# Patient Record
Sex: Male | Born: 1985
Health system: Southern US, Community
[De-identification: ages and names within clinical notes are randomized; demographics above are authoritative.]

## PROBLEM LIST (undated history)

## (undated) DIAGNOSIS — K219 Gastro-esophageal reflux disease without esophagitis: Secondary | ICD-10-CM

---
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
## 2007-02-22 ENCOUNTER — Ambulatory Visit: Payer: Self-pay

## 2009-09-25 ENCOUNTER — Inpatient Hospital Stay (HOSPITAL_COMMUNITY): Admission: EM | Admit: 2009-09-25 | Discharge: 2009-09-26 | Payer: Self-pay | Admitting: Emergency Medicine

## 2009-09-25 ENCOUNTER — Encounter (INDEPENDENT_AMBULATORY_CARE_PROVIDER_SITE_OTHER): Payer: Self-pay | Admitting: General Surgery

## 2010-10-12 IMAGING — CT CT ABDOMEN W/ CM
2 of 4 series · 14 of 32 positions shown, 19 images · IV contrast (water/omni  & 100 ML OMNI 300)
Comparison: None

CT ABDOMEN

CLINICAL DATA: Right lower quadrant pain.  Fever.

CT ABDOMEN AND PELVIS WITH CONTRAST
TECHNIQUE: Multidetector CT imaging of the abdomen and pelvis was
performed using the standard protocol following bolus
administration of intravenous contrast.
Contrast: 100 ml Zmnipaque-YPP and oral contrast

[Series 2: routine abdomen · axial · 0.83mm/px · z∈[-477,-157]mm · 7 of 86 slices shown, 12 images]
[im 11/86  soft-tissue]
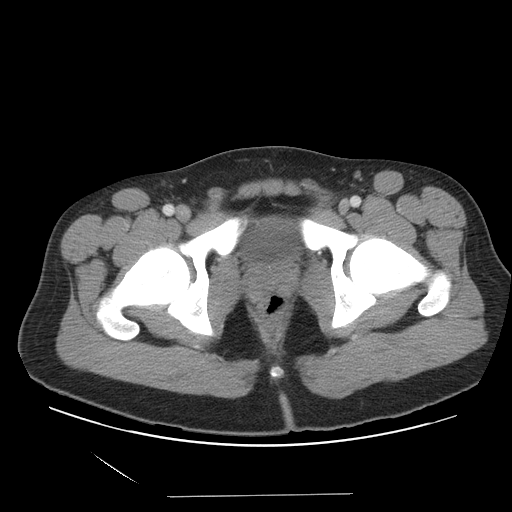
[im 11/86  bone]
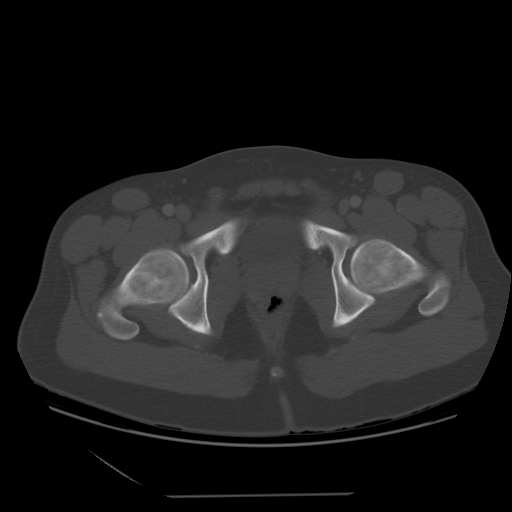
[im 22/86  soft-tissue]
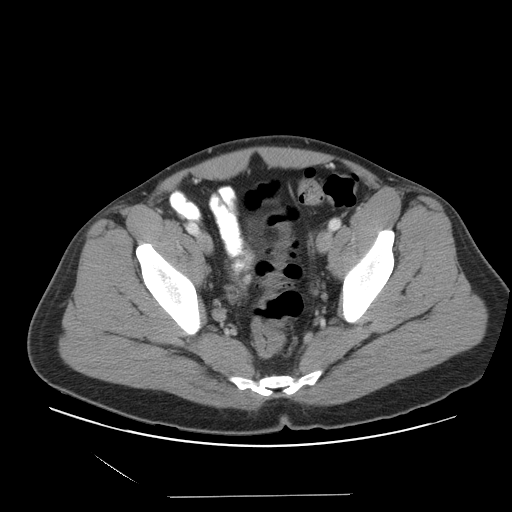
[im 32/86  soft-tissue]
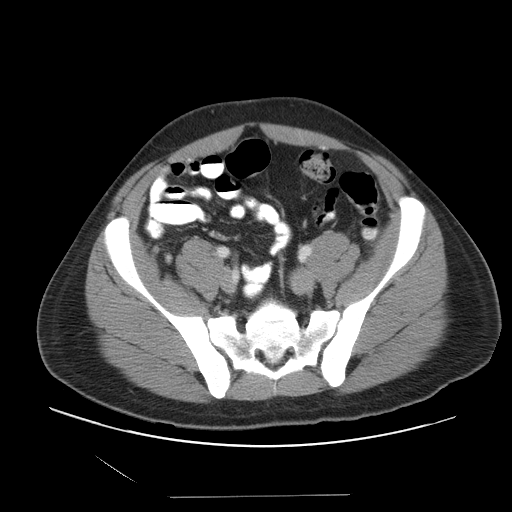
[im 43/86  soft-tissue]
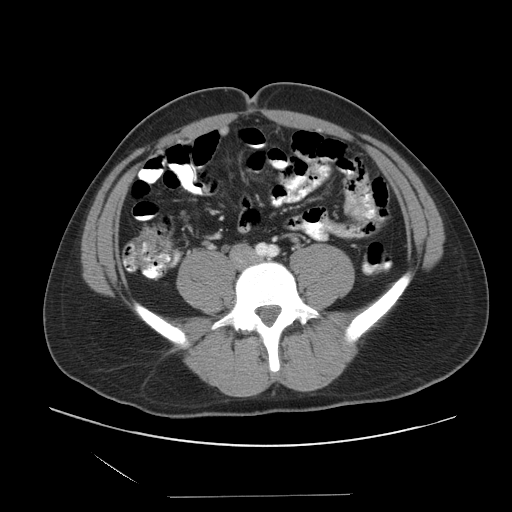
[im 43/86  lung]
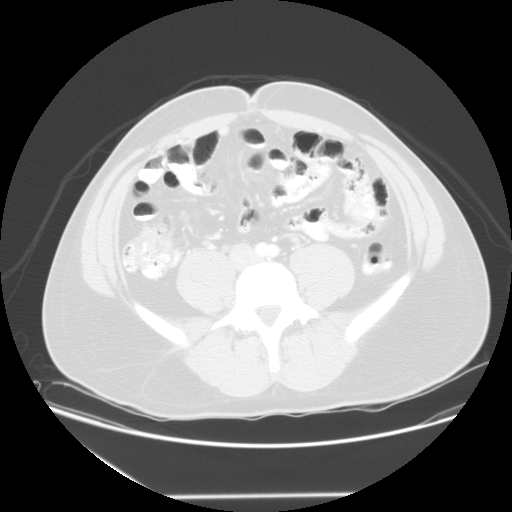
[im 54/86  soft-tissue]
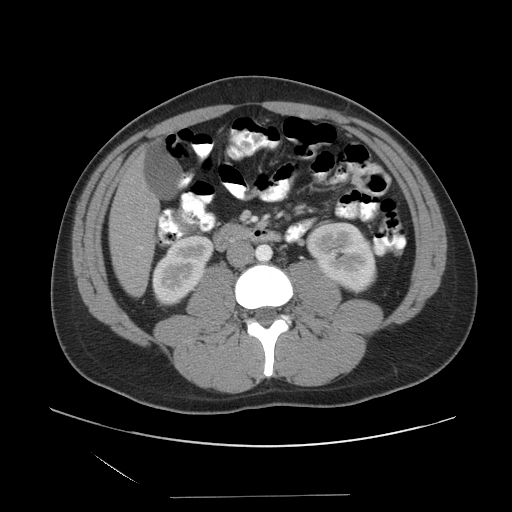
[im 54/86  lung]
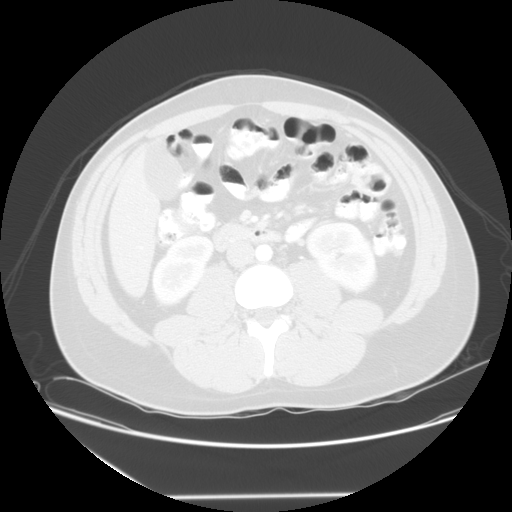
[im 64/86  soft-tissue]
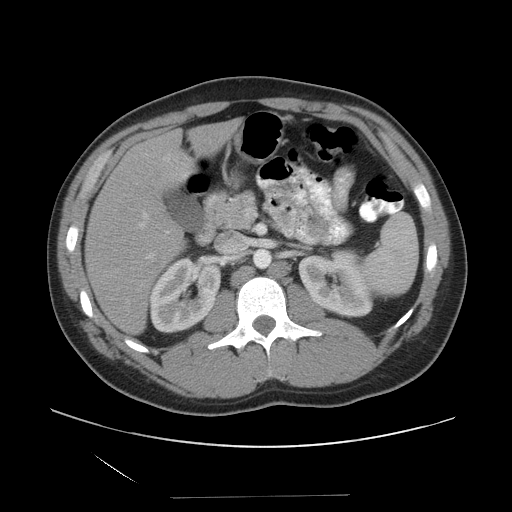
[im 64/86  lung]
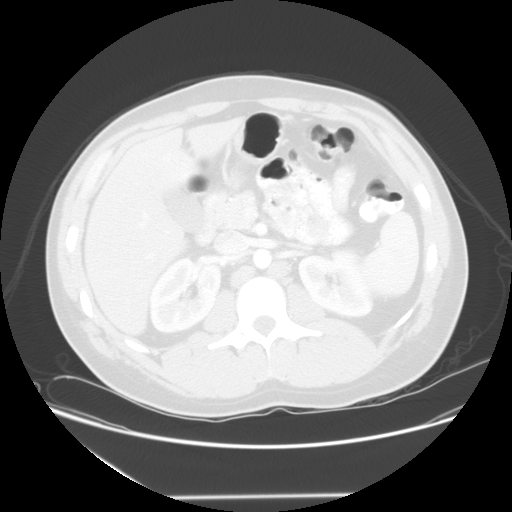
[im 75/86  soft-tissue]
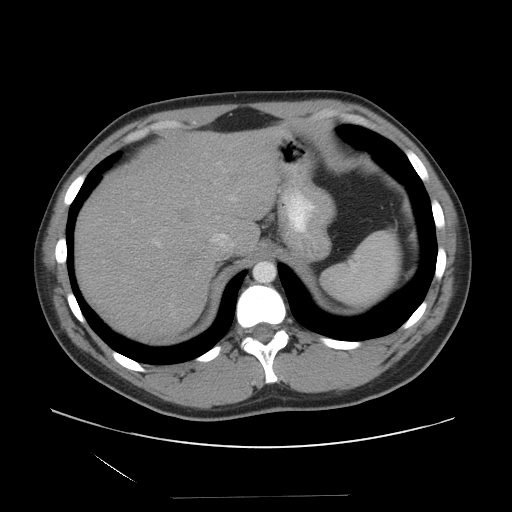
[im 75/86  lung]
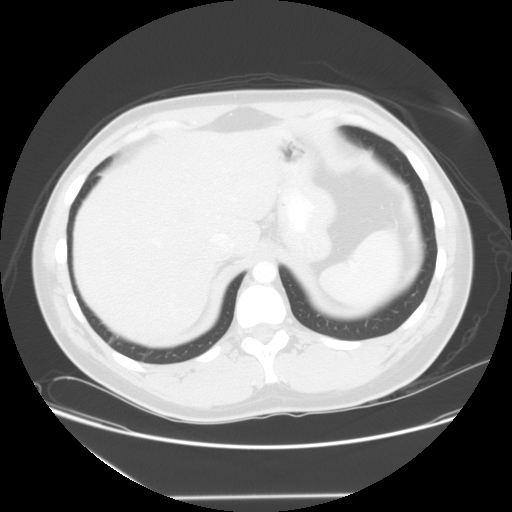

[Series 400: reformatted · sagittal · 0.89mm/px · 7 of 99 slices shown]
[im 10/99  soft-tissue]
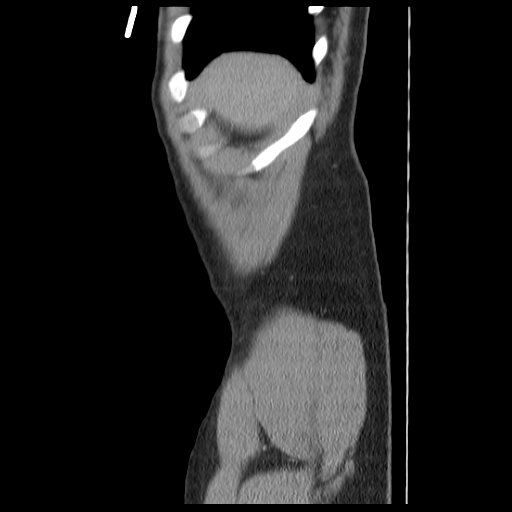
[im 20/99  soft-tissue]
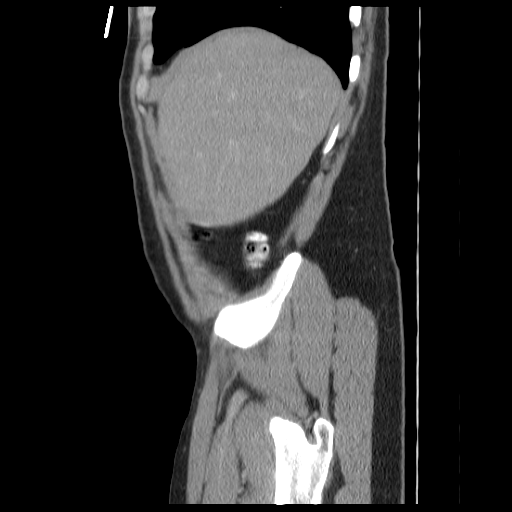
[im 30/99  soft-tissue]
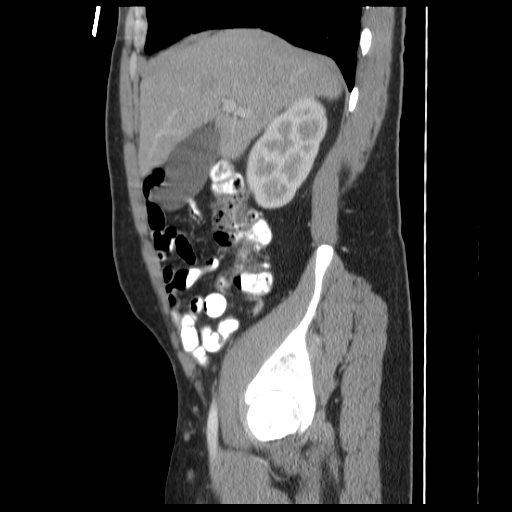
[im 40/99  soft-tissue]
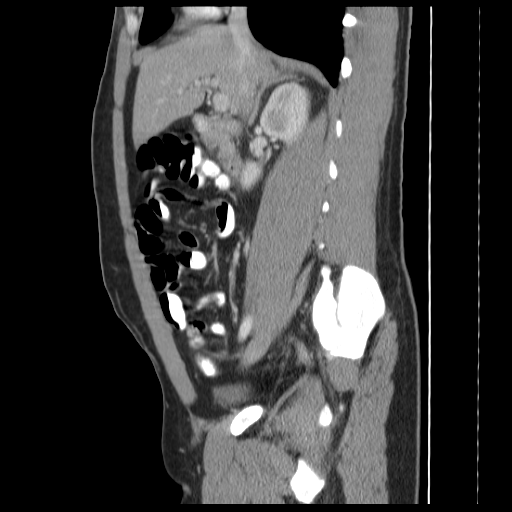
[im 59/99  soft-tissue]
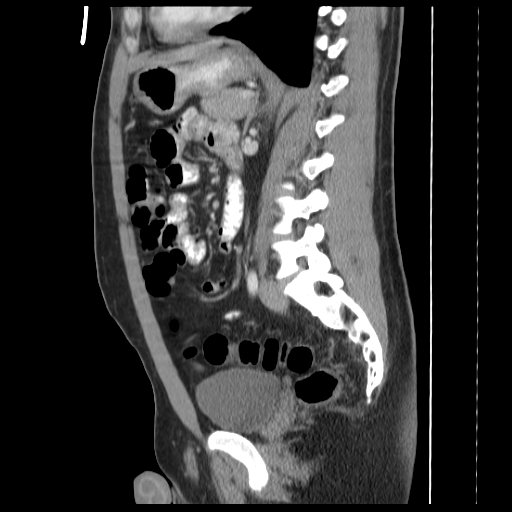
[im 69/99  soft-tissue]
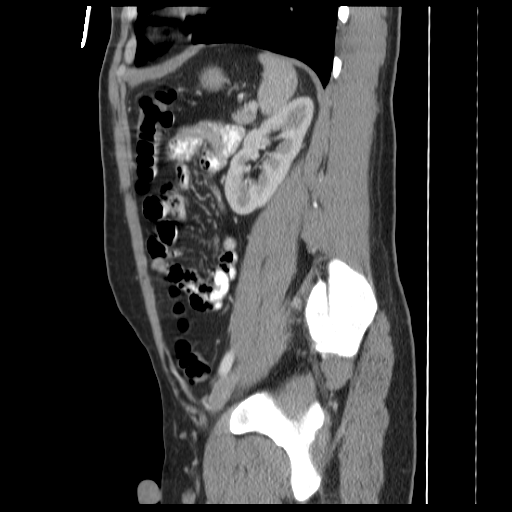
[im 79/99  soft-tissue]
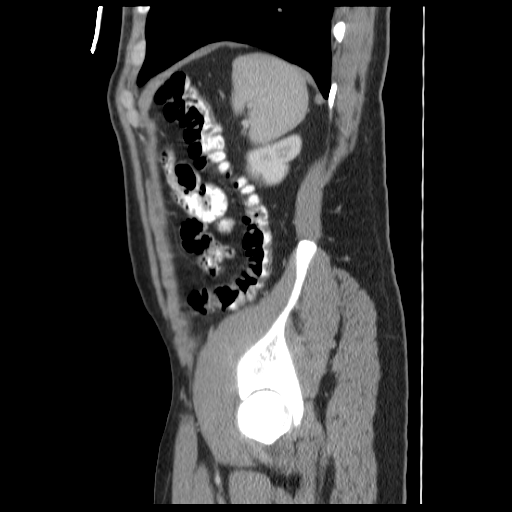

[14 of 32 positions shown; findings below may reference images not displayed]

FINDINGS: The abdominal parenchymal organs are normal in
appearance.  Gallbladder is unremarkable, and there is no evidence
of hydronephrosis.  No soft tissue masses or adenopathy identified
in the abdomen.

There is no evidence of inflammatory process or abnormal fluid
collections.  Abdominal bowel loops appear normal.
IMPRESSION: Negative abdomen CT.

CT PELVIS
FINDINGS: There is no evidence of pelvic masses or
lymphadenopathy. No evidence of ureteral calculi.

Normal appendix is visualized.  There is no evidence of
inflammatory process or abnormal fluid collections in the pelvis.
Pelvic bowel loops appear normal.
IMPRESSION: Negative pelvis CT.  No evidence of appendicitis or other acute
findings.

## 2010-12-16 ENCOUNTER — Emergency Department (HOSPITAL_COMMUNITY)
Admission: EM | Admit: 2010-12-16 | Discharge: 2010-12-16 | Payer: Self-pay | Source: Home / Self Care | Admitting: Emergency Medicine

## 2010-12-18 ENCOUNTER — Emergency Department (HOSPITAL_COMMUNITY)
Admission: EM | Admit: 2010-12-18 | Discharge: 2010-12-18 | Payer: Self-pay | Source: Home / Self Care | Admitting: Emergency Medicine

## 2011-02-06 ENCOUNTER — Emergency Department (HOSPITAL_COMMUNITY): Payer: PRIVATE HEALTH INSURANCE

## 2011-02-06 ENCOUNTER — Emergency Department (HOSPITAL_COMMUNITY)
Admission: EM | Admit: 2011-02-06 | Discharge: 2011-02-06 | Disposition: A | Discharge status: Home / Self Care | Payer: PRIVATE HEALTH INSURANCE | Attending: Emergency Medicine | Admitting: Emergency Medicine

## 2011-02-06 DIAGNOSIS — S0120XA Unspecified open wound of nose, initial encounter: Secondary | ICD-10-CM | POA: Insufficient documentation

## 2011-02-06 DIAGNOSIS — S022XXB Fracture of nasal bones, initial encounter for open fracture: Secondary | ICD-10-CM | POA: Insufficient documentation

## 2011-02-06 DIAGNOSIS — Y9229 Other specified public building as the place of occurrence of the external cause: Secondary | ICD-10-CM | POA: Insufficient documentation

## 2011-02-06 DIAGNOSIS — T148XXA Other injury of unspecified body region, initial encounter: Secondary | ICD-10-CM | POA: Insufficient documentation

## 2011-02-06 DIAGNOSIS — S022XXA Fracture of nasal bones, initial encounter for closed fracture: Secondary | ICD-10-CM | POA: Insufficient documentation

## 2011-02-06 DIAGNOSIS — S0990XA Unspecified injury of head, initial encounter: Secondary | ICD-10-CM | POA: Insufficient documentation

## 2011-04-02 LAB — URINALYSIS, ROUTINE W REFLEX MICROSCOPIC
Bilirubin Urine: NEGATIVE
Glucose, UA: NEGATIVE mg/dL
Ketones, ur: 15 mg/dL — AB
Leukocytes, UA: NEGATIVE
Nitrite: NEGATIVE
Protein, ur: NEGATIVE mg/dL
Specific Gravity, Urine: 1.025 (ref 1.005–1.030)
Urobilinogen, UA: 0.2 mg/dL (ref 0.0–1.0)
pH: 5 (ref 5.0–8.0)

## 2011-04-02 LAB — CBC
HCT: 41.4 % (ref 39.0–52.0)
HCT: 46 % (ref 39.0–52.0)
Hemoglobin: 14.2 g/dL (ref 13.0–17.0)
Hemoglobin: 15.8 g/dL (ref 13.0–17.0)
MCHC: 34.4 g/dL (ref 30.0–36.0)
MCHC: 34.4 g/dL (ref 30.0–36.0)
MCV: 91.4 fL (ref 78.0–100.0)
MCV: 91.5 fL (ref 78.0–100.0)
Platelets: 244 10*3/uL (ref 150–400)
Platelets: 274 10*3/uL (ref 150–400)
RBC: 4.53 MIL/uL (ref 4.22–5.81)
RBC: 5.03 MIL/uL (ref 4.22–5.81)
RDW: 12.4 % (ref 11.5–15.5)
RDW: 12.4 % (ref 11.5–15.5)
WBC: 10.2 10*3/uL (ref 4.0–10.5)
WBC: 9.9 10*3/uL (ref 4.0–10.5)

## 2011-04-02 LAB — SAMPLE TO BLOOD BANK

## 2011-04-02 LAB — DIFFERENTIAL
Basophils Absolute: 0.1 10*3/uL (ref 0.0–0.1)
Basophils Absolute: 0.2 10*3/uL — ABNORMAL HIGH (ref 0.0–0.1)
Basophils Relative: 1 % (ref 0–1)
Basophils Relative: 2 % — ABNORMAL HIGH (ref 0–1)
Eosinophils Absolute: 0 10*3/uL (ref 0.0–0.7)
Eosinophils Absolute: 0.1 10*3/uL (ref 0.0–0.7)
Eosinophils Relative: 0 % (ref 0–5)
Eosinophils Relative: 1 % (ref 0–5)
Lymphocytes Relative: 13 % (ref 12–46)
Lymphocytes Relative: 23 % (ref 12–46)
Lymphs Abs: 1.4 10*3/uL (ref 0.7–4.0)
Lymphs Abs: 2.3 10*3/uL (ref 0.7–4.0)
Monocytes Absolute: 0.4 10*3/uL (ref 0.1–1.0)
Monocytes Absolute: 0.6 10*3/uL (ref 0.1–1.0)
Monocytes Relative: 4 % (ref 3–12)
Monocytes Relative: 6 % (ref 3–12)
Neutro Abs: 6.9 10*3/uL (ref 1.7–7.7)
Neutro Abs: 8.2 10*3/uL — ABNORMAL HIGH (ref 1.7–7.7)
Neutrophils Relative %: 70 % (ref 43–77)
Neutrophils Relative %: 80 % — ABNORMAL HIGH (ref 43–77)

## 2011-04-02 LAB — URINE MICROSCOPIC-ADD ON

## 2011-04-02 LAB — POCT I-STAT, CHEM 8
BUN: 10 mg/dL (ref 6–23)
Calcium, Ion: 1.18 mmol/L (ref 1.12–1.32)
Chloride: 106 mEq/L (ref 96–112)
Creatinine, Ser: 1.2 mg/dL (ref 0.4–1.5)
Glucose, Bld: 111 mg/dL — ABNORMAL HIGH (ref 70–99)
HCT: 49 % (ref 39.0–52.0)
Hemoglobin: 16.7 g/dL (ref 13.0–17.0)
Potassium: 3.6 mEq/L (ref 3.5–5.1)
Sodium: 143 mEq/L (ref 135–145)
TCO2: 26 mmol/L (ref 0–100)

## 2013-09-24 ENCOUNTER — Other Ambulatory Visit: Payer: Self-pay | Admitting: Orthopedic Surgery

## 2013-09-24 DIAGNOSIS — M25561 Pain in right knee: Secondary | ICD-10-CM

## 2013-09-25 ENCOUNTER — Ambulatory Visit
Admission: RE | Admit: 2013-09-25 | Discharge: 2013-09-25 | Disposition: A | Discharge status: Home / Self Care | Payer: No Typology Code available for payment source | Source: Ambulatory Visit | Attending: Orthopedic Surgery | Admitting: Orthopedic Surgery

## 2013-09-25 DIAGNOSIS — M25561 Pain in right knee: Secondary | ICD-10-CM

## 2016-07-28 DIAGNOSIS — H5712 Ocular pain, left eye: Secondary | ICD-10-CM | POA: Diagnosis not present

## 2016-07-30 DIAGNOSIS — H00035 Abscess of left lower eyelid: Secondary | ICD-10-CM | POA: Diagnosis not present

## 2016-07-30 DIAGNOSIS — H00034 Abscess of left upper eyelid: Secondary | ICD-10-CM | POA: Diagnosis not present

## 2016-07-30 DIAGNOSIS — H10502 Unspecified blepharoconjunctivitis, left eye: Secondary | ICD-10-CM | POA: Diagnosis not present

## 2016-07-30 DIAGNOSIS — H5712 Ocular pain, left eye: Secondary | ICD-10-CM | POA: Diagnosis not present

## 2016-08-02 DIAGNOSIS — H00035 Abscess of left lower eyelid: Secondary | ICD-10-CM | POA: Diagnosis not present

## 2016-08-02 DIAGNOSIS — H5712 Ocular pain, left eye: Secondary | ICD-10-CM | POA: Diagnosis not present

## 2016-08-02 DIAGNOSIS — H00034 Abscess of left upper eyelid: Secondary | ICD-10-CM | POA: Diagnosis not present

## 2016-08-02 DIAGNOSIS — H10502 Unspecified blepharoconjunctivitis, left eye: Secondary | ICD-10-CM | POA: Diagnosis not present

## 2016-08-06 DIAGNOSIS — H5712 Ocular pain, left eye: Secondary | ICD-10-CM | POA: Diagnosis not present

## 2016-08-06 DIAGNOSIS — H168 Other keratitis: Secondary | ICD-10-CM | POA: Diagnosis not present

## 2016-08-06 DIAGNOSIS — H10013 Acute follicular conjunctivitis, bilateral: Secondary | ICD-10-CM | POA: Diagnosis not present

## 2016-08-27 DIAGNOSIS — H168 Other keratitis: Secondary | ICD-10-CM | POA: Diagnosis not present

## 2017-02-08 DIAGNOSIS — R202 Paresthesia of skin: Secondary | ICD-10-CM | POA: Diagnosis not present

## 2017-02-08 DIAGNOSIS — R61 Generalized hyperhidrosis: Secondary | ICD-10-CM | POA: Diagnosis not present

## 2017-02-08 DIAGNOSIS — K30 Functional dyspepsia: Secondary | ICD-10-CM | POA: Diagnosis not present

## 2017-02-08 DIAGNOSIS — R0789 Other chest pain: Secondary | ICD-10-CM | POA: Diagnosis not present

## 2017-02-08 DIAGNOSIS — R109 Unspecified abdominal pain: Secondary | ICD-10-CM | POA: Diagnosis not present

## 2017-02-08 DIAGNOSIS — Z0000 Encounter for general adult medical examination without abnormal findings: Secondary | ICD-10-CM | POA: Diagnosis not present

## 2017-02-08 DIAGNOSIS — R030 Elevated blood-pressure reading, without diagnosis of hypertension: Secondary | ICD-10-CM | POA: Diagnosis not present

## 2017-02-17 DIAGNOSIS — R0789 Other chest pain: Secondary | ICD-10-CM | POA: Diagnosis not present

## 2017-02-17 DIAGNOSIS — K219 Gastro-esophageal reflux disease without esophagitis: Secondary | ICD-10-CM | POA: Diagnosis not present

## 2017-06-23 DIAGNOSIS — S139XXA Sprain of joints and ligaments of unspecified parts of neck, initial encounter: Secondary | ICD-10-CM | POA: Diagnosis not present

## 2017-06-23 DIAGNOSIS — M7582 Other shoulder lesions, left shoulder: Secondary | ICD-10-CM | POA: Diagnosis not present

## 2017-07-11 DIAGNOSIS — M7582 Other shoulder lesions, left shoulder: Secondary | ICD-10-CM | POA: Diagnosis not present

## 2017-10-12 DIAGNOSIS — H5212 Myopia, left eye: Secondary | ICD-10-CM | POA: Diagnosis not present

## 2017-10-12 DIAGNOSIS — H5211 Myopia, right eye: Secondary | ICD-10-CM | POA: Diagnosis not present

## 2018-05-24 DIAGNOSIS — H00021 Hordeolum internum right upper eyelid: Secondary | ICD-10-CM | POA: Diagnosis not present

## 2018-06-09 DIAGNOSIS — H00021 Hordeolum internum right upper eyelid: Secondary | ICD-10-CM | POA: Diagnosis not present

## 2018-10-02 DIAGNOSIS — B029 Zoster without complications: Secondary | ICD-10-CM | POA: Diagnosis not present

## 2018-10-02 DIAGNOSIS — Z6830 Body mass index (BMI) 30.0-30.9, adult: Secondary | ICD-10-CM | POA: Diagnosis not present

## 2018-10-24 DIAGNOSIS — H5213 Myopia, bilateral: Secondary | ICD-10-CM | POA: Diagnosis not present

## 2018-11-13 DIAGNOSIS — R112 Nausea with vomiting, unspecified: Secondary | ICD-10-CM | POA: Diagnosis not present

## 2018-11-13 DIAGNOSIS — Z6830 Body mass index (BMI) 30.0-30.9, adult: Secondary | ICD-10-CM | POA: Diagnosis not present

## 2018-11-13 DIAGNOSIS — K30 Functional dyspepsia: Secondary | ICD-10-CM | POA: Diagnosis not present

## 2018-11-13 DIAGNOSIS — K219 Gastro-esophageal reflux disease without esophagitis: Secondary | ICD-10-CM | POA: Diagnosis not present

## 2018-11-28 DIAGNOSIS — R12 Heartburn: Secondary | ICD-10-CM | POA: Diagnosis not present

## 2018-11-28 DIAGNOSIS — K210 Gastro-esophageal reflux disease with esophagitis: Secondary | ICD-10-CM | POA: Diagnosis not present

## 2019-01-22 DIAGNOSIS — Z0000 Encounter for general adult medical examination without abnormal findings: Secondary | ICD-10-CM | POA: Diagnosis not present

## 2019-02-28 DIAGNOSIS — K209 Esophagitis, unspecified: Secondary | ICD-10-CM | POA: Diagnosis not present

## 2019-02-28 DIAGNOSIS — K219 Gastro-esophageal reflux disease without esophagitis: Secondary | ICD-10-CM | POA: Diagnosis not present

## 2019-03-06 ENCOUNTER — Other Ambulatory Visit: Payer: Self-pay

## 2019-03-06 ENCOUNTER — Emergency Department
Admission: EM | Admit: 2019-03-06 | Discharge: 2019-03-06 | Disposition: A | Discharge status: Home / Self Care | Payer: BLUE CROSS/BLUE SHIELD | Source: Home / Self Care | Attending: Family Medicine | Admitting: Family Medicine

## 2019-03-06 DIAGNOSIS — J029 Acute pharyngitis, unspecified: Secondary | ICD-10-CM

## 2019-03-06 HISTORY — DX: Gastro-esophageal reflux disease without esophagitis: K21.9

## 2019-03-06 LAB — POCT RAPID STREP A (OFFICE): Rapid Strep A Screen: NEGATIVE

## 2019-03-06 MED ORDER — LIDOCAINE VISCOUS HCL 2 % MT SOLN: 15.0000 mL | Freq: Three times a day (TID) | OROMUCOSAL | 0 refills | Status: DC

## 2019-03-06 NOTE — ED Triage Notes (Signed)
Yesterday around 2 pm sore throat, immediately.  Last night and today painful to swallow own spit.  Sister in [place] who is a physician, was quarantined yesterday due to testing a person she came in contact with.  Has not seen sister for 2 full weeks.   Has not travelled out of the country, but has been through [place],  [place], [employer] airports in the last week.

## 2019-03-06 NOTE — Discharge Instructions (Addendum)
°  If cold-like symptoms develop, try the following: Take plain guaifenesin (1200mg  extended release tabs such as Mucinex) twice daily, with plenty of water, for cough and congestion.  May add Pseudoephedrine (30mg , one or two every 4 to 6 hours) for sinus congestion.  Get adequate rest.   May use Afrin nasal spray (or generic oxymetazoline) each morning for about 5 days and then discontinue.  Also recommend using saline nasal spray several times daily and saline nasal irrigation (AYR is a common brand).  Use Flonase nasal spray each morning after using Afrin nasal spray and saline nasal irrigation. Try warm salt water gargles for sore throat.  Stop all antihistamines for now, and other non-prescription cough/cold preparations. May take Tylenol as needed for sore throat.

## 2019-03-06 NOTE — ED Provider Notes (Signed)
[facility]    CSN: [id number] Arrival date & time: 03/06/19  0904     History   Chief Complaint Chief Complaint  Patient presents with  . Sore Throat    HPI Kyle Thomas is a 33 y.o. male.    Patient developed a sore throat yesterday, now worse.  He had an episode of vomiting, now resolved.  He denies nasal congestion, cough, and fever.  He has travelled through several airports during the past week ([place], [place], [place], and [place]) but has not been out of the country.  The history is provided by the patient.    Past Medical History:  Diagnosis Date  . GERD (gastroesophageal reflux disease)     There are no active problems to display for this patient.        Home Medications    Prior to Admission medications   Medication Sig Start Date End Date Taking? Authorizing Provider  doxylamine, Sleep, (UNISOM) 25 MG tablet Take 25 mg by mouth 2 (two) times daily.   Yes [provider]  lidocaine (XYLOCAINE) 2 % solution Use as directed 15 mLs in the mouth or throat 4 (four) times daily -  before meals and at bedtime. Gargle, then expectorate 03/06/19   Lattie Haw, MD    Family History No family history on file.  Social History Social History   Tobacco Use  . Smoking status: Never Smoker  . Smokeless tobacco: Never Used  Substance Use Topics  . Alcohol use: Yes    Comment: 2 beer a month  . Drug use: Yes    Types: Marijuana    Comment: maybe once a month     Allergies   Patient has no known allergies.   Review of Systems Review of Systems + sore throat No cough No pleuritic pain No wheezing No nasal congestion ? post-nasal drainage No sinus pain/pressure No itchy/red eyes No earache No hemoptysis No SOB No fever/chills No nausea + vomiting, resolved No abdominal pain No diarrhea No urinary symptoms No skin rash + fatigue No myalgias No headache Used OTC meds without relief   Physical Exam Triage Vital  Signs ED Triage Vitals  Enc Vitals Group     BP 03/06/19 0928 125/83     Pulse Rate 03/06/19 0928 68     Resp 03/06/19 0928 20     Temp 03/06/19 0928 98.7 F (37.1 C)     Temp Source 03/06/19 0928 Oral     SpO2 03/06/19 0928 100 %     Weight 03/06/19 0929 232 lb (105.2 kg)     Height 03/06/19 0929 6\' 3"  (1.905 m)     Head Circumference --      Peak Flow --      Pain Score 03/06/19 0929 7     Pain Loc --      Pain Edu? --      Excl. in GC? --    No data found.  Updated Vital Signs BP 125/83 (BP Location: Right Arm)   Pulse 68   Temp 98.7 F (37.1 C) (Oral)   Resp 20   Ht 6\' 3"  (1.905 m)   Wt 105.2 kg   SpO2 100%   BMI 29.00 kg/m   Visual Acuity Right Eye Distance:   Left Eye Distance:   Bilateral Distance:    Right Eye Near:   Left Eye Near:    Bilateral Near:     Physical Exam Nursing notes and Vital Signs  reviewed. Appearance:  Patient appears stated age, and in no acute distress Eyes:  Pupils are equal, round, and reactive to light and accomodation.  Extraocular movement is intact.  Conjunctivae are not inflamed  Ears:  Canals normal.  Tympanic membranes normal.  Nose:  Mildly congested turbinates.  No sinus tenderness. Pharynx:  Uvula mildly edematous Neck:  Supple.  Enlarged posterior/lateral nodes are palpated bilaterally, tender to palpation on the left.   Lungs:  Clear to auscultation.  Breath sounds are equal.  Moving air well. Heart:  Regular rate and rhythm without murmurs, rubs, or gallops.  Abdomen:  Nontender without masses or hepatosplenomegaly.  Bowel sounds are present.  No CVA or flank tenderness.  Extremities:  No edema.  Skin:  No rash present.    UC Treatments / Results  Labs (all labs ordered are listed, but only abnormal results are displayed) Labs Reviewed  STREP A DNA PROBE  POCT RAPID STREP A (OFFICE) negative    EKG None  Radiology No results found.  Procedures Procedures (including critical care time)  Medications  Ordered in UC Medications - No data to display  Initial Impression / Assessment and Plan / UC Course  I have reviewed the triage vital signs and the nursing notes.  Pertinent labs & imaging results that were available during my care of the patient were reviewed by me and considered in my medical decision making (see chart for details).    There is no evidence of bacterial infection today.  Suspect early viral URI.  Treat symptomatically for now  Rx for lidocaine viscous. Throat culture pending. Followup with Family Doctor if not improved in about 10 days.  Final Clinical Impressions(s) / UC Diagnoses   Final diagnoses:  Sore throat  Pharyngitis, unspecified etiology     Discharge Instructions      If cold-like symptoms develop, try the following: Take plain guaifenesin (1200mg  extended release tabs such as Mucinex) twice daily, with plenty of water, for cough and congestion.  May add Pseudoephedrine (30mg , one or two every 4 to 6 hours) for sinus congestion.  Get adequate rest.   May use Afrin nasal spray (or generic oxymetazoline) each morning for about 5 days and then discontinue.  Also recommend using saline nasal spray several times daily and saline nasal irrigation (AYR is a common brand).  Use Flonase nasal spray each morning after using Afrin nasal spray and saline nasal irrigation. Try warm salt water gargles for sore throat.  Stop all antihistamines for now, and other non-prescription cough/cold preparations. May take Tylenol as needed for sore throat.       ED Prescriptions    Medication Sig Dispense Auth. Provider   lidocaine (XYLOCAINE) 2 % solution Use as directed 15 mLs in the mouth or throat 4 (four) times daily -  before meals and at bedtime. Gargle, then expectorate 200 mL Lattie Haw, MD        Lattie Haw, MD 03/12/19 [phone]

## 2019-03-07 ENCOUNTER — Telehealth: Payer: Self-pay | Admitting: Emergency Medicine

## 2019-03-07 LAB — STREP A DNA PROBE: Group A Strep Probe: DETECTED — AB

## 2019-03-07 MED ORDER — PENICILLIN V POTASSIUM 500 MG PO TABS: 500.0000 mg | ORAL_TABLET | Freq: Two times a day (BID) | ORAL | 0 refills | Status: AC

## 2019-03-07 MED ORDER — PENICILLIN V POTASSIUM 500 MG PO TABS: 500.0000 mg | ORAL_TABLET | Freq: Two times a day (BID) | ORAL | 0 refills | Status: DC

## 2019-03-07 NOTE — Telephone Encounter (Signed)
Patient is feeling better, culture neg

## 2019-03-07 NOTE — Telephone Encounter (Signed)
Correction Strep is Positive, Dr Cathren Harsh gave order over the phone for ABT. Called patient.Marland Kitchen

## 2019-03-19 ENCOUNTER — Other Ambulatory Visit: Payer: Self-pay

## 2019-03-19 ENCOUNTER — Emergency Department
Admission: EM | Admit: 2019-03-19 | Discharge: 2019-03-19 | Disposition: A | Discharge status: Home / Self Care | Payer: BLUE CROSS/BLUE SHIELD | Source: Home / Self Care

## 2019-03-19 ENCOUNTER — Encounter: Payer: Self-pay | Admitting: *Deleted

## 2019-03-19 DIAGNOSIS — R590 Localized enlarged lymph nodes: Secondary | ICD-10-CM | POA: Diagnosis not present

## 2019-03-19 DIAGNOSIS — J029 Acute pharyngitis, unspecified: Secondary | ICD-10-CM

## 2019-03-19 MED ORDER — CEFDINIR 300 MG PO CAPS: 600.0000 mg | ORAL_CAPSULE | Freq: Every day | ORAL | 0 refills | Status: DC

## 2019-03-19 MED ORDER — CHLORHEXIDINE GLUCONATE 0.12 % MT SOLN: 15.0000 mL | Freq: Two times a day (BID) | OROMUCOSAL | 0 refills | Status: AC

## 2019-03-19 NOTE — ED Triage Notes (Signed)
Pt c/o continued sore throat, back of tongue feels numb, and LT side of neck lymph node swollen x 2 days.

## 2019-03-19 NOTE — ED Provider Notes (Signed)
[facility]    CSN: [id number] Arrival date & time: 03/19/19  0854     History   Chief Complaint Chief Complaint  Patient presents with  . Sore Throat    HPI Kyle Thomas is a 33 y.o. male.   This is a 33 yo man who complains of sore throat.  He was originally seen 3/10 where a strep test was done and the results of which were positive and he began amoxicillin on the 13th.  He has a peculiar sensation in his posterior pharynx and tongue, akin to numbness.  He has had no fever or cough.  He has had some nasal congestion.   Note from 03/06/19:  Patient developed a sore throat yesterday, now worse.  He had an episode of vomiting, now resolved.  He denies nasal congestion, cough, and fever.  He has travelled through several airports during the past week ([place], [place], [place], and [place]) but has not been out of the country.     Past Medical History:  Diagnosis Date  . GERD (gastroesophageal reflux disease)     There are no active problems to display for this patient.   History reviewed. No pertinent surgical history.     Home Medications    Prior to Admission medications   Medication Sig Start Date End Date Taking? Authorizing Provider  cefdinir (OMNICEF) 300 MG capsule Take 2 capsules (600 mg total) by mouth daily. 03/19/19   Elvina Sidle, MD  chlorhexidine (PERIDEX) 0.12 % solution Use as directed 15 mLs in the mouth or throat 2 (two) times daily. 03/19/19   Elvina Sidle, MD    Family History History reviewed. No pertinent family history.  Social History Social History   Tobacco Use  . Smoking status: Current Some Day Smoker  . Smokeless tobacco: Never Used  . Tobacco comment: 1 cig q mth  Substance Use Topics  . Alcohol use: Yes    Comment: 2 beer a month  . Drug use: Yes    Types: Marijuana    Comment: maybe once a month     Allergies   Patient has no known allergies.   Review of Systems Review of Systems  HENT: Positive  for congestion and sore throat.   Respiratory: Negative for cough.   Gastrointestinal: Negative.   Genitourinary: Negative.   Musculoskeletal: Negative.   Neurological: Negative.   All other systems reviewed and are negative.    Physical Exam Triage Vital Signs ED Triage Vitals  Enc Vitals Group     BP      Pulse      Resp      Temp      Temp src      SpO2      Weight      Height      Head Circumference      Peak Flow      Pain Score      Pain Loc      Pain Edu?      Excl. in GC?    No data found.  Updated Vital Signs BP 127/84 (BP Location: Right Arm)   Pulse 62   Temp 98.1 F (36.7 C) (Oral)   Resp 18   Ht 6\' 3"  (1.905 m)   Wt 108.4 kg   SpO2 100%   BMI 29.87 kg/m   Physical Exam Vitals signs and nursing note reviewed.  Constitutional:      Appearance: He is well-developed.  HENT:  Head: Normocephalic and atraumatic.     Nose: Congestion present.     Mouth/Throat:     Mouth: Mucous membranes are moist.     Pharynx: Oropharynx is clear. Posterior oropharyngeal erythema present.     Tonsils: No tonsillar exudate or tonsillar abscesses.  Neck:     Musculoskeletal: Normal range of motion and neck supple.     Comments: Very tender left submandibular node Cardiovascular:     Rate and Rhythm: Normal rate and regular rhythm.     Heart sounds: Normal heart sounds.  Pulmonary:     Effort: Pulmonary effort is normal.     Breath sounds: Normal breath sounds.  Lymphadenopathy:     Cervical: Cervical adenopathy present.  Skin:    General: Skin is warm.  Neurological:     General: No focal deficit present.     Mental Status: He is alert.  Psychiatric:        Mood and Affect: Mood normal.        Behavior: Behavior normal.      UC Treatments / Results  Labs (all labs ordered are listed, but only abnormal results are displayed) Labs Reviewed - No data to display  EKG None  Radiology No results found.  Procedures Procedures (including  critical care time)  Medications Ordered in UC Medications - No data to display  Initial Impression / Assessment and Plan / UC Course  I have reviewed the triage vital signs and the nursing notes.  Pertinent labs & imaging results that were available during my care of the patient were reviewed by me and considered in my medical decision making (see chart for details).    Final Clinical Impressions(s) / UC Diagnoses   Final diagnoses:  Pharyngitis, unspecified etiology  Cervical adenopathy   Discharge Instructions   None    ED Prescriptions    Medication Sig Dispense Auth. Provider   cefdinir (OMNICEF) 300 MG capsule Take 2 capsules (600 mg total) by mouth daily. 20 capsule Elvina Sidle, MD   chlorhexidine (PERIDEX) 0.12 % solution Use as directed 15 mLs in the mouth or throat 2 (two) times daily. 120 mL Elvina Sidle, MD     Controlled Substance Prescriptions [place] Controlled Substance Registry consulted? Not Applicable   Elvina Sidle, MD 03/19/19 0930

## 2019-03-26 ENCOUNTER — Ambulatory Visit: Payer: BLUE CROSS/BLUE SHIELD

## 2019-03-26 ENCOUNTER — Ambulatory Visit: Payer: BLUE CROSS/BLUE SHIELD | Admitting: Podiatry

## 2019-03-26 ENCOUNTER — Encounter: Payer: Self-pay | Admitting: Podiatry

## 2019-03-26 ENCOUNTER — Telehealth: Payer: Self-pay

## 2019-03-26 ENCOUNTER — Ambulatory Visit (INDEPENDENT_AMBULATORY_CARE_PROVIDER_SITE_OTHER): Payer: BLUE CROSS/BLUE SHIELD

## 2019-03-26 ENCOUNTER — Other Ambulatory Visit: Payer: Self-pay

## 2019-03-26 VITALS — Temp 97.2°F

## 2019-03-26 DIAGNOSIS — M2580 Other specified joint disorders, unspecified joint: Secondary | ICD-10-CM

## 2019-03-26 DIAGNOSIS — M2140 Flat foot [pes planus] (acquired), unspecified foot: Secondary | ICD-10-CM

## 2019-03-26 DIAGNOSIS — T148XXA Other injury of unspecified body region, initial encounter: Secondary | ICD-10-CM

## 2019-03-26 DIAGNOSIS — M2141 Flat foot [pes planus] (acquired), right foot: Secondary | ICD-10-CM | POA: Diagnosis not present

## 2019-03-26 DIAGNOSIS — M2142 Flat foot [pes planus] (acquired), left foot: Secondary | ICD-10-CM | POA: Diagnosis not present

## 2019-03-26 DIAGNOSIS — M779 Enthesopathy, unspecified: Secondary | ICD-10-CM

## 2019-03-26 DIAGNOSIS — S99922A Unspecified injury of left foot, initial encounter: Secondary | ICD-10-CM

## 2019-03-26 DIAGNOSIS — M7751 Other enthesopathy of right foot: Secondary | ICD-10-CM | POA: Diagnosis not present

## 2019-03-26 MED ORDER — CEFDINIR 300 MG PO CAPS: 600.0000 mg | ORAL_CAPSULE | Freq: Every day | ORAL | 0 refills | Status: DC

## 2019-03-26 MED ORDER — MELOXICAM 15 MG PO TABS: 15.0000 mg | ORAL_TABLET | Freq: Every day | ORAL | 0 refills | Status: DC

## 2019-03-26 MED ORDER — MELOXICAM 15 MG PO TABS: 15.0000 mg | ORAL_TABLET | Freq: Every day | ORAL | 0 refills | Status: AC

## 2019-03-26 NOTE — Telephone Encounter (Signed)
Three more days to allow pt to complete the initial 10 day course of antibiotic sent to [facility] on file. He should start taking immediately as to limit the lag in time from last dose.

## 2019-03-27 NOTE — Progress Notes (Signed)
Subjective:   Patient ID: Kyle Thomas, male   DOB: 33 y.o.   MRN: [id number]   HPI 33 year old male presents the office today for concerns of right foot pain.  He states that he has had pain to the foot for quite some time is been gradually getting worse.  He points along the first MPJ where he gets majority of tenderness and he feels that he needs to have the toe popped.  He also points on the plantar aspect on the sesamoids where he gets discomfort.  He states that originally he switch to wearing tennis shoes and this was helping quite a bit but currently it is no longer helpful.  Denies any recent injury or trauma.  Minimal swelling.  No redness or warmth.  Also yesterday as a secondary concern he did step on a nail and he went to urgent care and got a tetanus shot.  He is currently on antibiotics for possible strep throat.  The nail did not go through a shoe or sock.   Review of Systems  All other systems reviewed and are negative.  Past Medical History:  Diagnosis Date  . GERD (gastroesophageal reflux disease)     History reviewed. No pertinent surgical history.   Current Outpatient Medications:  .  cefdinir (OMNICEF) 300 MG capsule, Take 2 capsules (600 mg total) by mouth daily for 3 days. To complete your 10 day course, Disp: 6 capsule, Rfl: 0 .  chlorhexidine (PERIDEX) 0.12 % solution, Use as directed 15 mLs in the mouth or throat 2 (two) times daily., Disp: 120 mL, Rfl: 0 .  meloxicam (MOBIC) 15 MG tablet, Take 1 tablet (15 mg total) by mouth daily., Disp: 30 tablet, Rfl: 0  No Known Allergies       Objective:  Physical Exam  General: AAO x3, NAD  Dermatological: On the medial aspect left foot more along the navicular tuberosity is area of the puncture with minimal edema and erythema there is no drainage or pus no fluctuation or crepitation.  Vascular: Dorsalis Pedis artery and Posterior Tibial artery pedal pulses are 2/4 bilateral with immedate capillary fill time.  There is no pain with calf compression, swelling, warmth, erythema.   Neruologic: Grossly intact via light touch bilateral.  Protective threshold with Semmes Wienstein monofilament intact to all pedal sites bilateral.   Musculoskeletal: There is a decrease in medial arch height upon weightbearing bilaterally.  During gait there is overpronation.  There is mild crepitation with present behavior to motion of the right foot.  Discomfort with flexion of the MPJ.  Minimal discomfort on the sesamoids upon palpation.  There is no other area pinpoint tenderness and there is no edema, erythema.  Gait: Unassisted, Nonantalgic.       Assessment:   Right first MPJ capsulitis, sesamoiditis; flatfoot; puncture wound left foot     Plan:  -Treatment options discussed including all alternatives, risks, and complications -Etiology of symptoms were discussed -X-rays were obtained and reviewed with the patient.  Flatfoot is present.  Mild ulceration is present the first MPJ.  No evidence of acute fracture. -Prescribed mobic. Discussed side effects of the medication and directed to stop if any are to occur and call the office.  -We discussed shoe modifications and orthotics.  I think at this point he benefit more from a custom orthotic.  Over this will help increase his arch as well as offload the first MPJ.  He was molded for inserts today.  Discussed temporarily to use  an over-the-counter insert and we discussed either power steps or super feet inserts.  -Regards to puncture wound he got a tetanus update yesterday.  He is on antibiotics currently for stroke to the right continue for this as well but monitoring signs or symptoms of infection.  Vivi Barrack DPM

## 2019-03-28 ENCOUNTER — Other Ambulatory Visit: Payer: Self-pay | Admitting: Podiatry

## 2019-03-28 DIAGNOSIS — M779 Enthesopathy, unspecified: Secondary | ICD-10-CM

## 2019-03-28 DIAGNOSIS — T148XXA Other injury of unspecified body region, initial encounter: Secondary | ICD-10-CM

## 2019-03-28 DIAGNOSIS — M2140 Flat foot [pes planus] (acquired), unspecified foot: Secondary | ICD-10-CM

## 2019-03-28 MED ORDER — CEFDINIR 300 MG PO CAPS: 600.0000 mg | ORAL_CAPSULE | Freq: Every day | ORAL | 0 refills | Status: AC

## 2019-03-28 NOTE — Telephone Encounter (Signed)
Patient would like Rx sent to [facility] on [address] in [place]. Sent.

## 2019-03-30 ENCOUNTER — Telehealth: Payer: Self-pay | Admitting: *Deleted

## 2019-03-30 NOTE — Telephone Encounter (Signed)
Pt called states Dr. Ardelle Anton said he needed SuperFeet and he needed a specific type. Pt states there are about 50 different typs and he would like Dr. Ardelle Anton to tell him what he is to choose since Dr. Ardelle Anton knows his situation.

## 2019-04-02 NOTE — Telephone Encounter (Signed)
I informed pt of Dr. Wagoner's recommendations. 

## 2019-04-02 NOTE — Telephone Encounter (Signed)
Garvey,  Good afternoon. I would look at the orange or green inserts for your feet.   Hope you are staying safe,  Dr. Ardelle Anton

## 2019-04-17 ENCOUNTER — Other Ambulatory Visit: Payer: BLUE CROSS/BLUE SHIELD | Admitting: Orthotics

## 2019-04-19 ENCOUNTER — Telehealth: Payer: Self-pay | Admitting: Podiatry

## 2019-04-19 DIAGNOSIS — M7752 Other enthesopathy of left foot: Secondary | ICD-10-CM | POA: Diagnosis not present

## 2019-04-19 DIAGNOSIS — M2141 Flat foot [pes planus] (acquired), right foot: Secondary | ICD-10-CM | POA: Diagnosis not present

## 2019-04-19 DIAGNOSIS — M7751 Other enthesopathy of right foot: Secondary | ICD-10-CM | POA: Diagnosis not present

## 2019-04-19 DIAGNOSIS — M2142 Flat foot [pes planus] (acquired), left foot: Secondary | ICD-10-CM | POA: Diagnosis not present

## 2019-04-19 NOTE — Telephone Encounter (Signed)
Pt left message asking me to call him back.  I returned call and he is wanting to proceed with the orthotics.I told him I would get Raiford Noble to order them possibly tomorrow if not Monday and call him when they come in.  He said he told Amy (he believes) that he would call her back and will call her tomorrow.

## 2019-04-19 NOTE — Telephone Encounter (Signed)
Can you please make sure the 1st MTPJ is offloaded

## 2019-04-20 NOTE — Telephone Encounter (Signed)
Also when they come in please make him an appointment with Raiford Noble to make sure they are fitting. Thank you.

## 2019-05-14 ENCOUNTER — Other Ambulatory Visit: Payer: Self-pay

## 2019-05-14 ENCOUNTER — Ambulatory Visit: Payer: BLUE CROSS/BLUE SHIELD | Admitting: Orthotics

## 2019-05-14 DIAGNOSIS — M2580 Other specified joint disorders, unspecified joint: Secondary | ICD-10-CM

## 2019-05-14 DIAGNOSIS — M2142 Flat foot [pes planus] (acquired), left foot: Secondary | ICD-10-CM

## 2019-05-14 DIAGNOSIS — M2141 Flat foot [pes planus] (acquired), right foot: Secondary | ICD-10-CM

## 2019-05-14 DIAGNOSIS — M7751 Other enthesopathy of right foot: Secondary | ICD-10-CM

## 2019-05-14 NOTE — Progress Notes (Signed)
Patient picked up f/o and seemed pleased with fit and comfort; I did shave down the k-wedge to fit a bit more comfortable in shoe; also I excavated some arch material on R so it wouldn't dig in.

## 2019-06-20 NOTE — Telephone Encounter (Signed)
Pt picked them up with Rick on 5.18.2020..  I did call pt and left a message checking to see how they were working for him and to see if he was wanting to get the second pair.

## 2019-06-20 NOTE — Telephone Encounter (Signed)
Thanks for the update. Let me know how he is doing! Thanks.

## 2020-04-18 DIAGNOSIS — H16002 Unspecified corneal ulcer, left eye: Secondary | ICD-10-CM | POA: Diagnosis not present

## 2020-04-22 DIAGNOSIS — H16002 Unspecified corneal ulcer, left eye: Secondary | ICD-10-CM | POA: Diagnosis not present

## 2020-08-04 DIAGNOSIS — K219 Gastro-esophageal reflux disease without esophagitis: Secondary | ICD-10-CM | POA: Diagnosis not present

## 2020-08-04 DIAGNOSIS — K209 Esophagitis, unspecified without bleeding: Secondary | ICD-10-CM | POA: Diagnosis not present

## 2020-08-19 DIAGNOSIS — R12 Heartburn: Secondary | ICD-10-CM | POA: Diagnosis not present

## 2020-08-19 DIAGNOSIS — K219 Gastro-esophageal reflux disease without esophagitis: Secondary | ICD-10-CM | POA: Diagnosis not present

## 2020-08-19 DIAGNOSIS — K449 Diaphragmatic hernia without obstruction or gangrene: Secondary | ICD-10-CM | POA: Diagnosis not present

## 2020-12-02 ENCOUNTER — Encounter: Payer: BLUE CROSS/BLUE SHIELD | Admitting: Family Medicine

## 2020-12-30 DIAGNOSIS — S0501XA Injury of conjunctiva and corneal abrasion without foreign body, right eye, initial encounter: Secondary | ICD-10-CM | POA: Diagnosis not present

## 2020-12-30 DIAGNOSIS — H0100A Unspecified blepharitis right eye, upper and lower eyelids: Secondary | ICD-10-CM | POA: Diagnosis not present

## 2021-01-02 DIAGNOSIS — S0501XA Injury of conjunctiva and corneal abrasion without foreign body, right eye, initial encounter: Secondary | ICD-10-CM | POA: Diagnosis not present

## 2021-01-02 DIAGNOSIS — H04123 Dry eye syndrome of bilateral lacrimal glands: Secondary | ICD-10-CM | POA: Diagnosis not present

## 2021-01-02 DIAGNOSIS — H0100A Unspecified blepharitis right eye, upper and lower eyelids: Secondary | ICD-10-CM | POA: Diagnosis not present

## 2021-01-16 DIAGNOSIS — Z13220 Encounter for screening for lipoid disorders: Secondary | ICD-10-CM | POA: Diagnosis not present

## 2021-01-16 DIAGNOSIS — K219 Gastro-esophageal reflux disease without esophagitis: Secondary | ICD-10-CM | POA: Diagnosis not present

## 2021-01-16 DIAGNOSIS — Z0000 Encounter for general adult medical examination without abnormal findings: Secondary | ICD-10-CM | POA: Diagnosis not present

## 2021-02-03 DIAGNOSIS — S0501XA Injury of conjunctiva and corneal abrasion without foreign body, right eye, initial encounter: Secondary | ICD-10-CM | POA: Diagnosis not present

## 2021-02-03 DIAGNOSIS — H04123 Dry eye syndrome of bilateral lacrimal glands: Secondary | ICD-10-CM | POA: Diagnosis not present

## 2021-02-03 DIAGNOSIS — H0100A Unspecified blepharitis right eye, upper and lower eyelids: Secondary | ICD-10-CM | POA: Diagnosis not present

## 2021-03-18 DIAGNOSIS — J309 Allergic rhinitis, unspecified: Secondary | ICD-10-CM | POA: Diagnosis not present

## 2021-03-18 DIAGNOSIS — R300 Dysuria: Secondary | ICD-10-CM | POA: Diagnosis not present

## 2021-04-16 DIAGNOSIS — R142 Eructation: Secondary | ICD-10-CM | POA: Diagnosis not present

## 2021-04-16 DIAGNOSIS — R1013 Epigastric pain: Secondary | ICD-10-CM | POA: Diagnosis not present

## 2021-04-16 DIAGNOSIS — K219 Gastro-esophageal reflux disease without esophagitis: Secondary | ICD-10-CM | POA: Diagnosis not present

## 2021-04-17 DIAGNOSIS — R1013 Epigastric pain: Secondary | ICD-10-CM | POA: Diagnosis not present

## 2021-10-06 DIAGNOSIS — J029 Acute pharyngitis, unspecified: Secondary | ICD-10-CM | POA: Diagnosis not present

## 2021-10-06 DIAGNOSIS — Z03818 Encounter for observation for suspected exposure to other biological agents ruled out: Secondary | ICD-10-CM | POA: Diagnosis not present

## 2021-10-07 DIAGNOSIS — R112 Nausea with vomiting, unspecified: Secondary | ICD-10-CM | POA: Diagnosis not present

## 2021-10-07 DIAGNOSIS — R079 Chest pain, unspecified: Secondary | ICD-10-CM | POA: Diagnosis not present

## 2021-10-07 DIAGNOSIS — K219 Gastro-esophageal reflux disease without esophagitis: Secondary | ICD-10-CM | POA: Diagnosis not present

## 2021-11-04 DIAGNOSIS — K219 Gastro-esophageal reflux disease without esophagitis: Secondary | ICD-10-CM | POA: Diagnosis not present

## 2022-05-14 ENCOUNTER — Emergency Department (HOSPITAL_BASED_OUTPATIENT_CLINIC_OR_DEPARTMENT_OTHER)
Admission: EM | Admit: 2022-05-14 | Discharge: 2022-05-14 | Disposition: A | Discharge status: Home / Self Care | Payer: BC Managed Care – PPO | Attending: Emergency Medicine | Admitting: Emergency Medicine

## 2022-05-14 ENCOUNTER — Encounter (HOSPITAL_BASED_OUTPATIENT_CLINIC_OR_DEPARTMENT_OTHER): Payer: Self-pay

## 2022-05-14 ENCOUNTER — Emergency Department (HOSPITAL_BASED_OUTPATIENT_CLINIC_OR_DEPARTMENT_OTHER): Payer: BC Managed Care – PPO

## 2022-05-14 ENCOUNTER — Other Ambulatory Visit: Payer: Self-pay

## 2022-05-14 DIAGNOSIS — X58XXXA Exposure to other specified factors, initial encounter: Secondary | ICD-10-CM | POA: Insufficient documentation

## 2022-05-14 DIAGNOSIS — K429 Umbilical hernia without obstruction or gangrene: Secondary | ICD-10-CM | POA: Diagnosis not present

## 2022-05-14 DIAGNOSIS — T189XXA Foreign body of alimentary tract, part unspecified, initial encounter: Secondary | ICD-10-CM | POA: Diagnosis not present

## 2022-05-14 DIAGNOSIS — K7689 Other specified diseases of liver: Secondary | ICD-10-CM | POA: Diagnosis not present

## 2022-05-14 NOTE — ED Provider Notes (Signed)
[facility] Provider Note   CSN: [id number] Arrival date & time: 05/14/22  1428     History {Add pertinent medical, surgical, social history, OB history to HPI:1} Chief Complaint  Patient presents with   Swallowed Foreign Body    Edu SELEDONIO BERTHELSEN is a 36 y.o. male presenting to the ED with concern for swallowed glass.  He [place] an airport [place] around 8pm having dinner and noticed when he was finishing his beer that there was broken glass shards at the bottom of his glass.  He thinks he may have swallowed some smaller pieces thinking they were ice.  He vomited this morning once.  HPI     Home Medications Prior to Admission medications   Medication Sig Start Date End Date Taking? Authorizing Provider  chlorhexidine (PERIDEX) 0.12 % solution Use as directed 15 mLs in the mouth or throat 2 (two) times daily. 03/19/19   Robyn Haber, MD  meloxicam (MOBIC) 15 MG tablet Take 1 tablet (15 mg total) by mouth daily. 03/26/19   Trula Slade, DPM      Allergies    Patient has no known allergies.    Review of Systems   Review of Systems  Physical Exam Updated Vital Signs BP 137/85 (BP Location: Right Arm)   Pulse 97   Temp 98.1 F (36.7 C)   Resp 16   Ht 6\' 3"  (1.905 m)   Wt 104.3 kg   SpO2 100%   BMI 28.75 kg/m  Physical Exam Constitutional:      General: He is not in acute distress. HENT:     Head: Normocephalic and atraumatic.  Eyes:     Conjunctiva/sclera: Conjunctivae normal.     Pupils: Pupils are equal, round, and reactive to light.  Cardiovascular:     Rate and Rhythm: Normal rate and regular rhythm.  Pulmonary:     Effort: Pulmonary effort is normal. No respiratory distress.  Abdominal:     General: There is no distension.     Tenderness: There is no abdominal tenderness. There is no guarding.  Skin:    General: Skin is warm and dry.  Neurological:     General: No focal deficit present.     Mental Status: He is  alert. Mental status is at baseline.    ED Results / Procedures / Treatments   Labs (all labs ordered are listed, but only abnormal results are displayed) Labs Reviewed - No data to display  EKG None  Radiology No results found.  Procedures Procedures  {Document cardiac monitor, telemetry assessment procedure when appropriate:1}  Medications Ordered in ED Medications - No data to display  ED Course/ Medical Decision Making/ A&P                           Medical Decision Making Amount and/or Complexity of Data Reviewed Radiology: ordered.   Pt here with swallowed foreign body suspected glass shards.  I reviewed phone images from the patient and these shards appeared 0.5-2 cm in length.  He vomited once and his photo today shows small shards.    He has a benign abd exam - low suspicion for Gi perforation, no significant GI upset, but does suffer from chronic "reflux" and vomiting in the morning is not unusual for him.  I spoke to radiologist and we've agreed a CT abdomen pelvis without contrast would be the best way to likely visualize these foreign bodies, some  of which may be quite small.  Pt now NPO.  {Document critical care time when appropriate:1} {Document review of labs and clinical decision tools ie heart score, Chads2Vasc2 etc:1}  {Document your independent review of radiology images, and any outside records:1} {Document your discussion with family members, caretakers, and with consultants:1} {Document social determinants of health affecting pt's care:1} {Document your decision making why or why not admission, treatments were needed:1} Final Clinical Impression(s) / ED Diagnoses Final diagnoses:  None    Rx / DC Orders ED Discharge Orders     None

## 2022-05-14 NOTE — ED Triage Notes (Signed)
Pt presents to ED from home for evaluation after he "ingested some glass shards" last night. Pt reports he was having a beer last night at a restaurant and after drinking about 2/3 he noticed there was crushed glass and glass shards in the bottom of glass. Pt denies abdominal pain.

## 2022-05-14 NOTE — Discharge Instructions (Signed)
Your gastroenterologist recommended that you use stool-bulking agents like high-fiber supplement (eg metamucil) and salads for the next 3 days to help with bulking your stool.  It is still possible you may have very small shards of glass passing through your system, but these will likely pass on their own without complications.  If you do experience sudden abdominal pain, a rigid abdomen with pain, worsening abdominal pain and vomiting, or blood stools with pain, please return to the ER.

## 2022-06-11 DIAGNOSIS — R059 Cough, unspecified: Secondary | ICD-10-CM | POA: Diagnosis not present

## 2022-06-11 DIAGNOSIS — R490 Dysphonia: Secondary | ICD-10-CM | POA: Diagnosis not present

## 2022-06-11 DIAGNOSIS — J069 Acute upper respiratory infection, unspecified: Secondary | ICD-10-CM | POA: Diagnosis not present

## 2022-07-21 ENCOUNTER — Ambulatory Visit (HOSPITAL_BASED_OUTPATIENT_CLINIC_OR_DEPARTMENT_OTHER): Payer: BC Managed Care – PPO | Admitting: Family Medicine

## 2022-09-13 ENCOUNTER — Ambulatory Visit (HOSPITAL_BASED_OUTPATIENT_CLINIC_OR_DEPARTMENT_OTHER): Payer: BC Managed Care – PPO | Admitting: Family Medicine

## 2022-09-14 ENCOUNTER — Ambulatory Visit (HOSPITAL_BASED_OUTPATIENT_CLINIC_OR_DEPARTMENT_OTHER): Payer: BC Managed Care – PPO | Admitting: Nurse Practitioner

## 2022-09-14 ENCOUNTER — Encounter (HOSPITAL_BASED_OUTPATIENT_CLINIC_OR_DEPARTMENT_OTHER): Payer: Self-pay

## 2022-11-03 DIAGNOSIS — B349 Viral infection, unspecified: Secondary | ICD-10-CM | POA: Diagnosis not present

## 2022-11-03 DIAGNOSIS — J9801 Acute bronchospasm: Secondary | ICD-10-CM | POA: Diagnosis not present

## 2022-11-03 DIAGNOSIS — R059 Cough, unspecified: Secondary | ICD-10-CM | POA: Diagnosis not present

## 2023-02-01 DIAGNOSIS — J069 Acute upper respiratory infection, unspecified: Secondary | ICD-10-CM | POA: Diagnosis not present

## 2023-02-04 DIAGNOSIS — H0102A Squamous blepharitis right eye, upper and lower eyelids: Secondary | ICD-10-CM | POA: Diagnosis not present

## 2023-02-04 DIAGNOSIS — H04123 Dry eye syndrome of bilateral lacrimal glands: Secondary | ICD-10-CM | POA: Diagnosis not present

## 2023-03-18 DIAGNOSIS — H0102A Squamous blepharitis right eye, upper and lower eyelids: Secondary | ICD-10-CM | POA: Diagnosis not present

## 2023-03-18 DIAGNOSIS — H5213 Myopia, bilateral: Secondary | ICD-10-CM | POA: Diagnosis not present

## 2023-03-18 DIAGNOSIS — H04123 Dry eye syndrome of bilateral lacrimal glands: Secondary | ICD-10-CM | POA: Diagnosis not present

## 2023-03-18 DIAGNOSIS — H1789 Other corneal scars and opacities: Secondary | ICD-10-CM | POA: Diagnosis not present

## 2023-03-18 DIAGNOSIS — H01004 Unspecified blepharitis left upper eyelid: Secondary | ICD-10-CM | POA: Diagnosis not present

## 2023-07-25 DIAGNOSIS — H00021 Hordeolum internum right upper eyelid: Secondary | ICD-10-CM | POA: Diagnosis not present

## 2023-07-25 DIAGNOSIS — K219 Gastro-esophageal reflux disease without esophagitis: Secondary | ICD-10-CM | POA: Diagnosis not present

## 2023-07-25 DIAGNOSIS — H0102A Squamous blepharitis right eye, upper and lower eyelids: Secondary | ICD-10-CM | POA: Diagnosis not present

## 2023-07-25 DIAGNOSIS — H04123 Dry eye syndrome of bilateral lacrimal glands: Secondary | ICD-10-CM | POA: Diagnosis not present

## 2024-06-04 DIAGNOSIS — Z0000 Encounter for general adult medical examination without abnormal findings: Secondary | ICD-10-CM | POA: Diagnosis not present

## 2024-06-04 DIAGNOSIS — E66811 Obesity, class 1: Secondary | ICD-10-CM | POA: Diagnosis not present

## 2024-06-04 DIAGNOSIS — F430 Acute stress reaction: Secondary | ICD-10-CM | POA: Diagnosis not present

## 2024-06-04 DIAGNOSIS — K219 Gastro-esophageal reflux disease without esophagitis: Secondary | ICD-10-CM | POA: Diagnosis not present

## 2024-06-04 DIAGNOSIS — R300 Dysuria: Secondary | ICD-10-CM | POA: Diagnosis not present

## 2024-06-05 DIAGNOSIS — Z3009 Encounter for other general counseling and advice on contraception: Secondary | ICD-10-CM | POA: Diagnosis not present

## 2024-08-08 DIAGNOSIS — K219 Gastro-esophageal reflux disease without esophagitis: Secondary | ICD-10-CM | POA: Diagnosis not present

## 2024-11-26 DIAGNOSIS — H1789 Other corneal scars and opacities: Secondary | ICD-10-CM | POA: Diagnosis not present

## 2024-11-26 DIAGNOSIS — H5213 Myopia, bilateral: Secondary | ICD-10-CM | POA: Diagnosis not present

## 2024-11-26 DIAGNOSIS — H04123 Dry eye syndrome of bilateral lacrimal glands: Secondary | ICD-10-CM | POA: Diagnosis not present

## 2024-11-26 DIAGNOSIS — H0102A Squamous blepharitis right eye, upper and lower eyelids: Secondary | ICD-10-CM | POA: Diagnosis not present

## 2024-11-26 DIAGNOSIS — H00021 Hordeolum internum right upper eyelid: Secondary | ICD-10-CM | POA: Diagnosis not present

## 2024-12-11 DIAGNOSIS — Z302 Encounter for sterilization: Secondary | ICD-10-CM | POA: Diagnosis not present
